# Patient Record
Sex: Female | Born: 1980
Health system: Southern US, Community
[De-identification: ages and names within clinical notes are randomized; demographics above are authoritative.]

## PROBLEM LIST (undated history)

## (undated) DIAGNOSIS — F32A Depression, unspecified: Secondary | ICD-10-CM

## (undated) DIAGNOSIS — F419 Anxiety disorder, unspecified: Secondary | ICD-10-CM

## (undated) DIAGNOSIS — E0500 Thyrotoxicosis with diffuse goiter without thyrotoxic crisis or storm: Secondary | ICD-10-CM

## (undated) DIAGNOSIS — E669 Obesity, unspecified: Secondary | ICD-10-CM

## (undated) DIAGNOSIS — G43909 Migraine, unspecified, not intractable, without status migrainosus: Secondary | ICD-10-CM

## (undated) DIAGNOSIS — F329 Major depressive disorder, single episode, unspecified: Secondary | ICD-10-CM

## (undated) HISTORY — DX: Depression, unspecified: F32.A

## (undated) HISTORY — DX: Obesity, unspecified: E66.9

## (undated) HISTORY — DX: Anxiety disorder, unspecified: F41.9

## (undated) HISTORY — DX: Migraine, unspecified, not intractable, without status migrainosus: G43.909

## (undated) HISTORY — DX: Major depressive disorder, single episode, unspecified: F32.9

## (undated) HISTORY — DX: Thyrotoxicosis with diffuse goiter without thyrotoxic crisis or storm: E05.00

## (undated) HISTORY — PX: MOUTH SURGERY: SHX715

---

## 2009-07-05 ENCOUNTER — Other Ambulatory Visit: Admission: RE | Admit: 2009-07-05 | Discharge: 2009-07-05 | Payer: Self-pay | Admitting: Family Medicine

## 2009-10-18 ENCOUNTER — Other Ambulatory Visit: Admission: RE | Admit: 2009-10-18 | Discharge: 2009-10-18 | Payer: Self-pay | Admitting: Family Medicine

## 2010-05-31 ENCOUNTER — Encounter (HOSPITAL_COMMUNITY): Admission: RE | Admit: 2010-05-31 | Discharge: 2010-07-28 | Payer: Self-pay | Admitting: Family Medicine

## 2010-06-06 ENCOUNTER — Other Ambulatory Visit: Admission: RE | Admit: 2010-06-06 | Discharge: 2010-06-06 | Payer: Self-pay | Admitting: Obstetrics and Gynecology

## 2010-12-08 ENCOUNTER — Other Ambulatory Visit (HOSPITAL_COMMUNITY): Payer: Self-pay | Admitting: Internal Medicine

## 2010-12-08 DIAGNOSIS — E049 Nontoxic goiter, unspecified: Secondary | ICD-10-CM

## 2010-12-08 DIAGNOSIS — E0590 Thyrotoxicosis, unspecified without thyrotoxic crisis or storm: Secondary | ICD-10-CM

## 2010-12-26 ENCOUNTER — Other Ambulatory Visit: Payer: Self-pay | Admitting: Obstetrics and Gynecology

## 2010-12-26 ENCOUNTER — Other Ambulatory Visit (HOSPITAL_COMMUNITY)
Admission: RE | Admit: 2010-12-26 | Discharge: 2010-12-26 | Disposition: A | Discharge status: Home / Self Care | Payer: 59 | Source: Ambulatory Visit | Attending: Obstetrics and Gynecology | Admitting: Obstetrics and Gynecology

## 2010-12-26 DIAGNOSIS — Z01419 Encounter for gynecological examination (general) (routine) without abnormal findings: Secondary | ICD-10-CM | POA: Insufficient documentation

## 2010-12-26 DIAGNOSIS — R87810 Cervical high risk human papillomavirus (HPV) DNA test positive: Secondary | ICD-10-CM | POA: Insufficient documentation

## 2010-12-29 ENCOUNTER — Other Ambulatory Visit (HOSPITAL_COMMUNITY): Payer: Self-pay | Admitting: Internal Medicine

## 2010-12-29 ENCOUNTER — Ambulatory Visit (HOSPITAL_COMMUNITY)
Admission: RE | Admit: 2010-12-29 | Discharge: 2010-12-29 | Disposition: A | Discharge status: Home / Self Care | Payer: 59 | Source: Ambulatory Visit | Attending: Internal Medicine | Admitting: Internal Medicine

## 2010-12-29 DIAGNOSIS — E0590 Thyrotoxicosis, unspecified without thyrotoxic crisis or storm: Secondary | ICD-10-CM

## 2010-12-29 DIAGNOSIS — R450 Nervousness: Secondary | ICD-10-CM | POA: Insufficient documentation

## 2010-12-29 DIAGNOSIS — R259 Unspecified abnormal involuntary movements: Secondary | ICD-10-CM | POA: Insufficient documentation

## 2010-12-29 DIAGNOSIS — E049 Nontoxic goiter, unspecified: Secondary | ICD-10-CM

## 2010-12-29 DIAGNOSIS — R220 Localized swelling, mass and lump, head: Secondary | ICD-10-CM | POA: Insufficient documentation

## 2010-12-30 ENCOUNTER — Ambulatory Visit (HOSPITAL_COMMUNITY): Admission: RE | Admit: 2010-12-30 | Payer: Self-pay | Source: Ambulatory Visit

## 2010-12-30 MED ORDER — SODIUM PERTECHNETATE TC 99M INJECTION
10.0000 | Freq: Once | INTRAVENOUS | Status: AC | PRN
  Administered 2010-12-30: 10 via INTRAVENOUS

## 2010-12-30 MED ORDER — SODIUM IODIDE I 131 CAPSULE
0.0100 | Freq: Once | INTRAVENOUS | Status: AC | PRN
  Administered 2010-12-30: 0.01 via ORAL

## 2011-01-06 ENCOUNTER — Ambulatory Visit (HOSPITAL_COMMUNITY)
Admission: RE | Admit: 2011-01-06 | Discharge: 2011-01-06 | Disposition: A | Discharge status: Home / Self Care | Payer: 59 | Source: Ambulatory Visit | Attending: Internal Medicine | Admitting: Internal Medicine

## 2011-01-06 DIAGNOSIS — E0590 Thyrotoxicosis, unspecified without thyrotoxic crisis or storm: Secondary | ICD-10-CM

## 2011-01-06 MED ORDER — SODIUM IODIDE I 131 CAPSULE
16.2900 | Freq: Once | INTRAVENOUS | Status: AC | PRN
  Administered 2011-01-06: 16.29 via ORAL

## 2011-02-28 ENCOUNTER — Other Ambulatory Visit: Payer: Self-pay | Admitting: Obstetrics and Gynecology

## 2011-07-13 ENCOUNTER — Other Ambulatory Visit (HOSPITAL_COMMUNITY)
Admission: RE | Admit: 2011-07-13 | Discharge: 2011-07-13 | Disposition: A | Discharge status: Home / Self Care | Payer: 59 | Source: Ambulatory Visit | Attending: Obstetrics and Gynecology | Admitting: Obstetrics and Gynecology

## 2011-07-13 ENCOUNTER — Other Ambulatory Visit: Payer: Self-pay | Admitting: Obstetrics and Gynecology

## 2011-07-13 DIAGNOSIS — Z01419 Encounter for gynecological examination (general) (routine) without abnormal findings: Secondary | ICD-10-CM | POA: Insufficient documentation

## 2011-07-13 DIAGNOSIS — R87810 Cervical high risk human papillomavirus (HPV) DNA test positive: Secondary | ICD-10-CM | POA: Insufficient documentation

## 2011-09-21 ENCOUNTER — Other Ambulatory Visit: Payer: Self-pay | Admitting: Obstetrics and Gynecology

## 2011-12-26 ENCOUNTER — Encounter: Payer: 59 | Admitting: Oncology

## 2011-12-27 NOTE — Progress Notes (Signed)
Note dictated for this already

## 2012-01-10 ENCOUNTER — Other Ambulatory Visit (HOSPITAL_COMMUNITY)
Admission: RE | Admit: 2012-01-10 | Discharge: 2012-01-10 | Disposition: A | Discharge status: Home / Self Care | Payer: 59 | Source: Ambulatory Visit | Attending: Obstetrics and Gynecology | Admitting: Obstetrics and Gynecology

## 2012-01-10 ENCOUNTER — Other Ambulatory Visit: Payer: Self-pay | Admitting: Obstetrics and Gynecology

## 2012-01-10 DIAGNOSIS — R87810 Cervical high risk human papillomavirus (HPV) DNA test positive: Secondary | ICD-10-CM | POA: Insufficient documentation

## 2012-01-10 DIAGNOSIS — Z01419 Encounter for gynecological examination (general) (routine) without abnormal findings: Secondary | ICD-10-CM | POA: Insufficient documentation

## 2012-07-11 ENCOUNTER — Other Ambulatory Visit: Payer: Self-pay | Admitting: Obstetrics and Gynecology

## 2012-07-11 ENCOUNTER — Other Ambulatory Visit (HOSPITAL_COMMUNITY)
Admission: RE | Admit: 2012-07-11 | Discharge: 2012-07-11 | Disposition: A | Discharge status: Home / Self Care | Payer: 59 | Source: Ambulatory Visit | Attending: Obstetrics and Gynecology | Admitting: Obstetrics and Gynecology

## 2012-07-11 DIAGNOSIS — Z01419 Encounter for gynecological examination (general) (routine) without abnormal findings: Secondary | ICD-10-CM | POA: Insufficient documentation

## 2012-07-11 DIAGNOSIS — Z1151 Encounter for screening for human papillomavirus (HPV): Secondary | ICD-10-CM | POA: Insufficient documentation

## 2012-07-11 DIAGNOSIS — R87810 Cervical high risk human papillomavirus (HPV) DNA test positive: Secondary | ICD-10-CM | POA: Insufficient documentation

## 2012-08-13 ENCOUNTER — Telehealth: Payer: Self-pay | Admitting: *Deleted

## 2012-08-13 NOTE — Telephone Encounter (Signed)
*  error in charting 

## 2012-09-10 ENCOUNTER — Other Ambulatory Visit: Payer: Self-pay | Admitting: Obstetrics and Gynecology

## 2013-01-08 ENCOUNTER — Other Ambulatory Visit (HOSPITAL_COMMUNITY)
Admission: RE | Admit: 2013-01-08 | Discharge: 2013-01-08 | Disposition: A | Discharge status: Home / Self Care | Payer: 59 | Source: Ambulatory Visit | Attending: Obstetrics and Gynecology | Admitting: Obstetrics and Gynecology

## 2013-01-08 ENCOUNTER — Other Ambulatory Visit: Payer: Self-pay | Admitting: Obstetrics and Gynecology

## 2013-01-08 DIAGNOSIS — Z01419 Encounter for gynecological examination (general) (routine) without abnormal findings: Secondary | ICD-10-CM | POA: Insufficient documentation

## 2013-01-30 ENCOUNTER — Ambulatory Visit (INDEPENDENT_AMBULATORY_CARE_PROVIDER_SITE_OTHER): Payer: 59 | Admitting: Neurology

## 2013-01-30 ENCOUNTER — Encounter: Payer: Self-pay | Admitting: Neurology

## 2013-01-30 VITALS — BP 114/78 | HR 70 | Temp 98.9°F | Ht 66.0 in | Wt 209.0 lb

## 2013-01-30 DIAGNOSIS — E669 Obesity, unspecified: Secondary | ICD-10-CM

## 2013-01-30 DIAGNOSIS — R251 Tremor, unspecified: Secondary | ICD-10-CM | POA: Insufficient documentation

## 2013-01-30 DIAGNOSIS — E0500 Thyrotoxicosis with diffuse goiter without thyrotoxic crisis or storm: Secondary | ICD-10-CM

## 2013-01-30 DIAGNOSIS — G43909 Migraine, unspecified, not intractable, without status migrainosus: Secondary | ICD-10-CM

## 2013-01-30 DIAGNOSIS — R259 Unspecified abnormal involuntary movements: Secondary | ICD-10-CM

## 2013-01-30 HISTORY — DX: Migraine, unspecified, not intractable, without status migrainosus: G43.909

## 2013-01-30 HISTORY — DX: Obesity, unspecified: E66.9

## 2013-01-30 NOTE — Progress Notes (Signed)
Subjective:    Patient ID: Natasha Li is a 32 y.o. female.  HPI  Huston Foley, MD, PhD [facility] [address] [address] [place], [place] [address]  Dear Dr. Kevan Ny,  I saw your patient, Natasha Li, upon your kind request in my neurologic clinic today for initial consultation of her hand tremor. The patient is unaccompanied today. As you know, Natasha Li is a very pleasant 32 year old right-handed woman who has noticed a bilateral hand tremor for the past month or so. It is described as almost always constant and particularly worse with handwriting. She has not noticed any shaking of her voice or her head and there is no family history of any tremor. Of note, she has an underlying history of Graves' disease, diagnosed 2 1/2 or 3 y ago, status post ablative surgery with subsequent hypothyroidism, followed by Dr. Sharl Ma. When she first started having thyroid problems, she had a very severe tremor, but that improved with treatment of her thyroid d/s. She was recently checked in that regard and was told that her thyroid was not causing the tremors. She also has a underlying diagnosis of depression or bipolar affective disorder and is followed by Dr. Lonell Face. She is on Lamictal and Prozac for mood and discontinued both medications for about a month, because she could not afford them at the time and when she restarted her medications, that's when she noted the tremor. Her medications include Xanax 0.5 mg each bedtime when necessary, multivitamin, atenolol 50 mg once daily, Ortho Tri-Cyclen, levothyroxine 50 mcg once daily, Imitrex 100 mg as needed for migraine headaches, Prozac 60 mg once daily, Lamictal 300 mg 1-1/2 tablets once daily at bedtime. She has not noticed any particular triggers or alleviating factors or time of day when the tremors are worse. It tends to vary from day to day and also during the day. She has noticed it while typing on the computer and while holding  something on her arms. It is more of a nuisance rather than a true hindrance for her. She has not noticed any association with how well she is rested or how well she feels in general. She does not drink any  caffeine needed beverages on a day-to-day basis.  She denies snoring and actually had a sleep study, because of complaint of tiredness and she was told that she did not have OSA and that she did not go into REM sleep at the time.  She is taking Atenolol for tachycardia in the context of her thyroid dysfunction.  She has a history of migraine headaches since age 66 and has them about once every month and has had good success with prn Imitrex.   Her Past Medical History Is Significant For: Past Medical History  Diagnosis Date  . Graves disease   . Depression   . Anxiety   . Obesity, unspecified 01/30/2013  . Migraine, unspecified, without mention of intractable migraine without mention of status migrainosus 01/30/2013    Her Past Surgical History Is Significant For: Past Surgical History  Procedure Laterality Date  . Mouth surgery  19 years ago    wisdom teeth    Her Family History Is Significant For: Family History  Problem Relation Age of Onset  . Thyroid disease Maternal Uncle   . Glaucoma Father     Her Social History Is Significant For: History   Social History  . Marital Status: Single    Spouse Name: N/A    Number of Children:  N/A  . Years of Education: N/A   Social History Main Topics  . Smoking status: Current Every Day Smoker    Types: Cigarettes, Pipe, Cigars  . Smokeless tobacco: None  . Alcohol Use: 0.5 oz/week    1 drink(s) per week  . Drug Use: No  . Sexually Active: Yes -- Female partner(s)   Other Topics Concern  . None   Social History Narrative             Her Allergies Are:  No Known Allergies:   Her Current Medications Are:  Outpatient Encounter Prescriptions as of 01/30/2013  Medication Sig Dispense Refill  . ALPRAZolam (XANAX) 0.5 MG  tablet       . atenolol (TENORMIN) 50 MG tablet       . FLUoxetine (PROZAC) 20 MG capsule       . lamoTRIgine (LAMICTAL) 200 MG tablet       . ORTHO TRI-CYCLEN, 28, 0.18/0.215/0.25 MG-35 MCG tablet       . SUMAtriptan (IMITREX) 100 MG tablet        No facility-administered encounter medications on file as of 01/30/2013.  :  Review of Systems  Constitutional: Positive for fatigue and unexpected weight change (gain).  Endocrine: Positive for heat intolerance and polydipsia.  Skin:       moles  Psychiatric/Behavioral: Positive for sleep disturbance (too much), dysphoric mood and agitation.       Racing thoughts    Objective:  Neurologic Exam  Physical Exam  Physical Examination:   Filed Vitals:   01/30/13 0848  BP: 114/78  Pulse: 70  Temp: 98.9 F (37.2 C)    General Examination: The patient is a very pleasant 32 y.o. female in no acute distress.  HEENT: Normocephalic, atraumatic, pupils are equal, round and reactive to light and accommodation. Funduscopic exam is normal with sharp disc margins noted. Extraocular tracking is good without nystagmus noted. Normal smooth pursuit is noted. Hearing is grossly intact. Tympanic membranes are clear bilaterally. Face is symmetric with normal facial animation and normal facial sensation. Speech is clear with no dysarthria noted. There is no hypophonia. There is no lip, neck or jaw tremor. Neck is supple with full range of motion. There are no carotid bruits on auscultation. Oropharynx exam reveals normal findings. No significant airway crowding is noted. Mallampati is class II. Tongue protrudes centrally and palate elevates symmetrically. Tonsils are 1+.   Chest: is clear to auscultation without wheezing, rhonchi or crackles noted.  Heart: sounds are regular and normal without murmurs, rubs or gallops noted.   Abdomen: is soft, non-tender and non-distended with normal bowel sounds appreciated on auscultation.  Extremities: There is no  pitting edema in the distal lower extremities bilaterally. Pedal pulses are intact.  Skin: is warm and dry with no trophic changes noted.  Musculoskeletal: exam reveals no obvious joint deformities, tenderness or joint swelling or erythema.  Neurologically:  Mental status: The patient is awake, alert and oriented in all 4 spheres. Her memory, attention, language and knowledge are appropriate. There is no aphasia, agnosia, apraxia or anomia. Speech is clear with normal prosody and enunciation. Thought process is linear. Mood is congruent and normal. Affect is normal.  Cranial nerves are as described above under HEENT exam. In addition, shoulder shrug is normal with equal shoulder height noted. Motor exam: Normal bulk, strength and tone is noted. There is no drift, resting tremor or rebound. Romberg is negative. Reflexes are 2+ throughout. Toes are downgoing bilaterally. Fine motor  skills are intact with normal finger taps, normal hand movements, normal rapid alternating patting, normal foot taps and normal foot agility. She had a slight bilateral UE postural and action tremor. On Archimedes spiral drawing she has a minimal bilateral degree of tremor, her handwriting is non-tremulous.  Cerebellar testing shows no dysmetria or intention tremor on finger to nose testing. There is no truncal or gait ataxia.  Sensory exam is intact to light touch, pinprick, vibration, temperature sense and proprioception in the upper and lower extremities.  Gait, station and balance are unremarkable. No veering to one side is noted. No leaning to one side. Posture is age-appropriate and stance is narrow based. No problems turning are noted.                Assessment and Plan:     Assessment and Plan:  In summary, Natasha Li is a very pleasant 33 y.o.-year old female with a history of hand tremors. Most likely, this represents a drug-induced tremor versus enhanced physiological tremor. It is rather benign at this point  I reassured the patient that she has a nonfocal exam otherwise. She certainly does not have any features of parkinsonism. She is advised about the possible diagnosis, its prognosis and treatment options. This is a rather mild presentation at this moment and I would not recommend any new medication for this. I had a long chat with the patient about my findings and the diagnosis, its prognosis and treatment options. We talked about medical treatments and non-pharmacological approaches. We talked about Assuming the watchful waiting position at this moment. I do not think it's worth our while to try to change her from Prozac to another antidepressant. In particular, she has been tried on other antidepressants in the past on Prozac is working well for her and she feels stable mood wise. We talked about maintaining a healthy lifestyle in general and also I advised her about common tremor triggers including blood sugar fluctuations, nervousness, being anxious, dehydration, not being rested and so forth. I encouraged the patient to eat healthy, exercise daily and keep well hydrated, to keep a scheduled bedtime and wake time routine, to not skip any meals and eat healthy snacks in between meals and to have protein with every meal.   I answered all her questions today and the patient  was in agreement with the plan. I would like to see her back in 6 months, sooner if the need arises and encouraged her to call with any interim questions, concerns, problems or updates.   Thank you very much for allowing me to participate in the care of this nice patient. If I can be of any further assistance to you please do not hesitate to call me at [phone].  Sincerely,   Huston Foley, MD, PhD

## 2013-01-30 NOTE — Patient Instructions (Addendum)
I think you have a benign tremor, it may be drug induced vs. Enhanced physiologic tremor. Your exam otherwise in normal.   I do have some generic suggestions for you today:   Please make sure that you drink plenty of fluids. I would like for you to exercise daily for example in the form of walking 20-30 minutes every day. Please keep regular sleep-wake schedule, keep regular meal times, do not skip any meals, eat  healthy snacks in between meals. Try to eat protein with every meal.  I do not think we need to make any changes in your medications at this point. I think you're stable enough that I can see you back in 6 months. Please call us if you have any interim questions, concerns, or problems or updates to discuss. We are not doing any new tests at this moment. Brett Canales is my clinical assistant and will answer any of your questions and relay your messages to me.  Our phone number is [phone]. We also have an after hours call service as well as a physician on-call for urgent questions. For any emergencies you know to call 911 or go to the emergency room.

## 2013-06-10 ENCOUNTER — Telehealth: Payer: Self-pay | Admitting: Neurology

## 2013-07-10 ENCOUNTER — Other Ambulatory Visit (HOSPITAL_COMMUNITY)
Admission: RE | Admit: 2013-07-10 | Discharge: 2013-07-10 | Disposition: A | Discharge status: Home / Self Care | Payer: 59 | Source: Ambulatory Visit | Attending: Obstetrics and Gynecology | Admitting: Obstetrics and Gynecology

## 2013-07-10 ENCOUNTER — Other Ambulatory Visit: Payer: Self-pay | Admitting: Obstetrics and Gynecology

## 2013-07-10 DIAGNOSIS — Z1151 Encounter for screening for human papillomavirus (HPV): Secondary | ICD-10-CM | POA: Insufficient documentation

## 2013-07-10 DIAGNOSIS — Z01419 Encounter for gynecological examination (general) (routine) without abnormal findings: Secondary | ICD-10-CM | POA: Insufficient documentation

## 2013-07-22 ENCOUNTER — Ambulatory Visit: Payer: 59 | Admitting: Neurology

## 2013-08-01 ENCOUNTER — Ambulatory Visit: Payer: 59 | Admitting: Neurology

## 2013-10-13 ENCOUNTER — Ambulatory Visit (INDEPENDENT_AMBULATORY_CARE_PROVIDER_SITE_OTHER): Payer: 59 | Admitting: Podiatry

## 2013-10-13 ENCOUNTER — Encounter: Payer: Self-pay | Admitting: Podiatry

## 2013-10-13 VITALS — BP 108/75 | HR 84 | Resp 18

## 2013-10-13 DIAGNOSIS — L03039 Cellulitis of unspecified toe: Secondary | ICD-10-CM

## 2013-10-13 NOTE — Patient Instructions (Signed)

## 2013-10-13 NOTE — Progress Notes (Signed)
° °  Subjective:    Patient ID: Natasha Li, female    DOB: 02/21/1981, 32 y.o.   MRN: [id number]  HPI had my big toenails done in march or April and now my left big toenail and my pointing up and puss was coming out of it and left big toenail has been going on since mid November    Review of Systems     Objective:   Physical Exam        Assessment & Plan:

## 2013-10-15 NOTE — Progress Notes (Signed)
Subjective:     Patient ID: Natasha Li, female   DOB: Jun 02, 1981, 32 y.o.   MRN: [id number]  HPI patient states that I'm having a lot of problems with his big toenail on my left foot. It was doing fine and now it has become red on both the inside and outside with drainage that I have tried to soak without relief   Review of Systems     Objective:   Physical Exam Neurovascular status intact with no health history changes noted. Patient is found to have a erythematous painful left hallux nail is loose with damage to both medial and lateral side and necrosis noted    Assessment:     Significant paronychia infection with damage hallux nail left foot that has failed to respond to nail corner removal    Plan:     Educated patient on deformity and at this time I have recommended removal of the nail and removal of necrotic tissue from both medial and lateral side of the bed. I then discussed long-term permanent removal of the nail bed. Infiltrated the left hallux 60 mg Xylocaine Marcaine mixture remove the hallux nail and using sterile sharp instrumentation deep debridement of both the medial and lateral side and reduced and removed necrotic tissue and allowed channel for drainage. Applied sterile dressing and instructed on soaks

## 2013-11-03 ENCOUNTER — Encounter: Payer: Self-pay | Admitting: Podiatry

## 2013-11-03 ENCOUNTER — Ambulatory Visit (INDEPENDENT_AMBULATORY_CARE_PROVIDER_SITE_OTHER): Payer: 59 | Admitting: Podiatry

## 2013-11-03 VITALS — BP 131/95 | HR 117 | Temp 99.5°F | Resp 17 | Ht 64.0 in | Wt 204.0 lb

## 2013-11-03 DIAGNOSIS — L600 Ingrowing nail: Secondary | ICD-10-CM

## 2013-11-03 NOTE — Progress Notes (Signed)
Subjective:     Patient ID: Natasha Li, female   DOB: [birth year], 32 y.o.   MRN: [id number]  HPI patient presents with left foot stating the nailbed seems to be healing okay but I like to get these corners fixed so I don't get ingrown to the corner like I have had in the past   Review of Systems     Objective:   Physical Exam Neurovascular status intact with a granulated nailbed left hallux that has history of incurvation in the corners with no indication of the current infection    Assessment:     Healing well from nail removal secondary to acute paronychia infection left hallux 4 weeks ago    Plan:     Recommended removal of the corners and allowing the rest of the nail to regrow explaining it may not be grow normally and ultimately she may lose the entire nail. Patient wants to procedure understanding all risks associated with nail surgery and today I infiltrated 60 mg Xylocaine Marcaine mixture removed the nail bed exposed the nail plates on the medial lateral side and just on the corners apply chemical phenol 3 applications followed by alcohol lavaged and sterile dressing reappoint her recheck

## 2013-11-03 NOTE — Patient Instructions (Signed)

## 2013-11-03 NOTE — Progress Notes (Signed)
Pt states "It feels okay, it just looks ucky."

## 2014-02-05 NOTE — Telephone Encounter (Signed)
Closing encounter

## 2014-07-14 ENCOUNTER — Other Ambulatory Visit (HOSPITAL_COMMUNITY)
Admission: RE | Admit: 2014-07-14 | Discharge: 2014-07-14 | Disposition: A | Discharge status: Home / Self Care | Payer: 59 | Source: Ambulatory Visit | Attending: Obstetrics and Gynecology | Admitting: Obstetrics and Gynecology

## 2014-07-14 ENCOUNTER — Other Ambulatory Visit: Payer: Self-pay | Admitting: Obstetrics and Gynecology

## 2014-07-14 DIAGNOSIS — Z01419 Encounter for gynecological examination (general) (routine) without abnormal findings: Secondary | ICD-10-CM | POA: Diagnosis present

## 2014-07-16 LAB — CYTOLOGY - PAP

## 2014-09-29 ENCOUNTER — Encounter: Payer: Self-pay | Admitting: *Deleted

## 2015-07-20 ENCOUNTER — Other Ambulatory Visit: Payer: Self-pay | Admitting: Obstetrics and Gynecology

## 2015-07-20 ENCOUNTER — Other Ambulatory Visit (HOSPITAL_COMMUNITY)
Admission: RE | Admit: 2015-07-20 | Discharge: 2015-07-20 | Disposition: A | Discharge status: Home / Self Care | Payer: 59 | Source: Ambulatory Visit | Attending: Obstetrics and Gynecology | Admitting: Obstetrics and Gynecology

## 2015-07-20 DIAGNOSIS — Z01419 Encounter for gynecological examination (general) (routine) without abnormal findings: Secondary | ICD-10-CM | POA: Insufficient documentation

## 2015-07-22 LAB — CYTOLOGY - PAP

## 2015-08-31 ENCOUNTER — Encounter: Payer: Self-pay | Admitting: Podiatry

## 2015-08-31 ENCOUNTER — Ambulatory Visit (INDEPENDENT_AMBULATORY_CARE_PROVIDER_SITE_OTHER): Payer: 59 | Admitting: Podiatry

## 2015-08-31 VITALS — BP 130/78 | HR 73 | Resp 16

## 2015-08-31 DIAGNOSIS — L600 Ingrowing nail: Secondary | ICD-10-CM | POA: Diagnosis not present

## 2015-08-31 MED ORDER — CEPHALEXIN 500 MG PO CAPS: 500.0000 mg | ORAL_CAPSULE | Freq: Three times a day (TID) | ORAL | Status: DC

## 2015-08-31 MED ORDER — CLINDAMYCIN HCL 150 MG PO CAPS: 150.0000 mg | ORAL_CAPSULE | Freq: Three times a day (TID) | ORAL | Status: DC

## 2015-08-31 NOTE — Patient Instructions (Addendum)

## 2015-09-01 NOTE — Progress Notes (Signed)
Subjective:     Patient ID: Natasha Li, female   DOB: May 15, 1981, 34 y.o.   MRN: [id number]  HPI patient states I been having some redness in my left big toenail and I'm not sure when this started but it's been recently and I did well when you remove the nail several years ago   Review of Systems     Objective:   Physical Exam Neurovascular status intact muscle strength adequate with redness around the lateral side of the left hallux at the proximal nail fold with no redness past the interphalangeal joint or drainage or swelling. The areas moderately tender when pressed    Assessment:     Paronychia infection of the left hallux lateral side with no history of trauma    Plan:     Precautionary x-ray reviewed with patient and I then went ahead infiltrated the left hallux 60 mg Xylocaine Marcaine mixture removed the lateral border removed all proud flesh abscess tissue and allowed channel for drainage. Instructed on soaks

## 2015-09-03 ENCOUNTER — Telehealth: Payer: Self-pay | Admitting: *Deleted

## 2015-09-03 NOTE — Telephone Encounter (Signed)
Left message for patient at [phone] (Home #) to check to see how they were doing from their ingrown toenail procedure that was performed on Tuesday, August 31, 2015. Waiting for a response.

## 2016-03-15 ENCOUNTER — Ambulatory Visit (INDEPENDENT_AMBULATORY_CARE_PROVIDER_SITE_OTHER): Payer: 59 | Admitting: Podiatry

## 2016-03-15 DIAGNOSIS — L600 Ingrowing nail: Secondary | ICD-10-CM | POA: Diagnosis not present

## 2016-03-15 NOTE — Patient Instructions (Addendum)

## 2016-03-16 NOTE — Progress Notes (Signed)
Subjective:     Patient ID: Natasha Li, female   DOB: 1981-01-19, 35 y.o.   MRN: [id number]  HPI patient presents with pain in the right hallux lateral border with incurvation of the bed and multiple signs of ingrown toenail   Review of Systems     Objective:   Physical Exam Neurovascular status intact muscle strength adequate range of motion within normal limits with incurvation of the right hallux lateral border with exquisite discomfort noted laterally with distal redness and slight drainage but localized in nature    Assessment:     Severe ingrown toenail deformity right hallux lateral border    Plan:     H&P and condition reviewed with patient. I've recommended removal of the corner of the nail and I explained procedure and risk and patient wants procedure. Today I went ahead and I infiltrated the right hallux 60 mg Xylocaine Marcaine mixture remove the lateral corner exposed matrix and applied phenol 3 applications 30 seconds followed by alcohol lavage and sterile dressing. Gave instructions on soaks and reappoint

## 2016-03-24 ENCOUNTER — Telehealth: Payer: Self-pay | Admitting: *Deleted

## 2016-03-24 NOTE — Telephone Encounter (Signed)
Left message for patient at [phone] (Home #) to check to see how they were doing from their ingrown toenail procedure that was performed on Wednesday, Mar 15, 2016. Waiting for a response.

## 2016-07-21 ENCOUNTER — Other Ambulatory Visit (HOSPITAL_COMMUNITY)
Admission: RE | Admit: 2016-07-21 | Discharge: 2016-07-21 | Disposition: A | Discharge status: Home / Self Care | Payer: 59 | Source: Ambulatory Visit | Attending: Obstetrics and Gynecology | Admitting: Obstetrics and Gynecology

## 2016-07-21 ENCOUNTER — Other Ambulatory Visit: Payer: Self-pay | Admitting: Obstetrics and Gynecology

## 2016-07-21 DIAGNOSIS — Z1151 Encounter for screening for human papillomavirus (HPV): Secondary | ICD-10-CM | POA: Diagnosis not present

## 2016-07-21 DIAGNOSIS — Z01419 Encounter for gynecological examination (general) (routine) without abnormal findings: Secondary | ICD-10-CM | POA: Insufficient documentation

## 2016-07-24 LAB — CYTOLOGY - PAP

## 2016-12-29 DIAGNOSIS — E039 Hypothyroidism, unspecified: Secondary | ICD-10-CM | POA: Diagnosis not present

## 2017-03-01 DIAGNOSIS — Z13220 Encounter for screening for lipoid disorders: Secondary | ICD-10-CM | POA: Diagnosis not present

## 2017-03-01 DIAGNOSIS — G4711 Idiopathic hypersomnia with long sleep time: Secondary | ICD-10-CM | POA: Diagnosis not present

## 2017-08-28 DIAGNOSIS — Z01419 Encounter for gynecological examination (general) (routine) without abnormal findings: Secondary | ICD-10-CM | POA: Diagnosis not present

## 2017-09-05 DIAGNOSIS — G4711 Idiopathic hypersomnia with long sleep time: Secondary | ICD-10-CM | POA: Diagnosis not present

## 2018-01-02 DIAGNOSIS — E038 Other specified hypothyroidism: Secondary | ICD-10-CM | POA: Diagnosis not present

## 2018-01-02 DIAGNOSIS — E0500 Thyrotoxicosis with diffuse goiter without thyrotoxic crisis or storm: Secondary | ICD-10-CM | POA: Diagnosis not present

## 2018-01-02 DIAGNOSIS — E063 Autoimmune thyroiditis: Secondary | ICD-10-CM | POA: Diagnosis not present

## 2018-01-15 DIAGNOSIS — Z86018 Personal history of other benign neoplasm: Secondary | ICD-10-CM | POA: Diagnosis not present

## 2018-01-15 DIAGNOSIS — D1801 Hemangioma of skin and subcutaneous tissue: Secondary | ICD-10-CM | POA: Diagnosis not present

## 2018-01-15 DIAGNOSIS — L814 Other melanin hyperpigmentation: Secondary | ICD-10-CM | POA: Diagnosis not present

## 2018-05-01 DIAGNOSIS — G43909 Migraine, unspecified, not intractable, without status migrainosus: Secondary | ICD-10-CM | POA: Diagnosis not present

## 2018-05-01 DIAGNOSIS — G4711 Idiopathic hypersomnia with long sleep time: Secondary | ICD-10-CM | POA: Diagnosis not present

## 2018-07-18 ENCOUNTER — Telehealth: Payer: Self-pay | Admitting: *Deleted

## 2018-07-18 NOTE — Telephone Encounter (Signed)
Chart opened by mistake

## 2018-08-06 DIAGNOSIS — Z23 Encounter for immunization: Secondary | ICD-10-CM | POA: Diagnosis not present

## 2018-10-17 DIAGNOSIS — Z01419 Encounter for gynecological examination (general) (routine) without abnormal findings: Secondary | ICD-10-CM | POA: Diagnosis not present

## 2018-10-31 DIAGNOSIS — G4711 Idiopathic hypersomnia with long sleep time: Secondary | ICD-10-CM | POA: Diagnosis not present

## 2018-10-31 DIAGNOSIS — E890 Postprocedural hypothyroidism: Secondary | ICD-10-CM | POA: Diagnosis not present

## 2018-11-05 DIAGNOSIS — Z13220 Encounter for screening for lipoid disorders: Secondary | ICD-10-CM | POA: Diagnosis not present

## 2018-11-05 DIAGNOSIS — Z131 Encounter for screening for diabetes mellitus: Secondary | ICD-10-CM | POA: Diagnosis not present

## 2018-12-11 DIAGNOSIS — L659 Nonscarring hair loss, unspecified: Secondary | ICD-10-CM | POA: Diagnosis not present

## 2018-12-11 DIAGNOSIS — E038 Other specified hypothyroidism: Secondary | ICD-10-CM | POA: Diagnosis not present

## 2018-12-11 DIAGNOSIS — E0500 Thyrotoxicosis with diffuse goiter without thyrotoxic crisis or storm: Secondary | ICD-10-CM | POA: Diagnosis not present

## 2019-01-14 DIAGNOSIS — D225 Melanocytic nevi of trunk: Secondary | ICD-10-CM | POA: Diagnosis not present

## 2019-01-14 DIAGNOSIS — Z86018 Personal history of other benign neoplasm: Secondary | ICD-10-CM | POA: Diagnosis not present

## 2019-01-14 DIAGNOSIS — L814 Other melanin hyperpigmentation: Secondary | ICD-10-CM | POA: Diagnosis not present

## 2020-01-11 ENCOUNTER — Ambulatory Visit: Payer: 59 | Attending: Internal Medicine

## 2020-01-11 DIAGNOSIS — Z23 Encounter for immunization: Secondary | ICD-10-CM | POA: Insufficient documentation

## 2020-01-11 NOTE — Progress Notes (Signed)
   Covid-19 Vaccination Clinic  Name:  Natasha Li    MRN: [id number] DOB: 1980/12/11  01/11/2020  [name] was observed post Covid-19 immunization for 15 minutes without incident. She was provided with Vaccine Information Sheet and instruction to access the V-Safe system.   [name] was instructed to call 911 with any severe reactions post vaccine: Marland Kitchen Difficulty breathing  . Swelling of face and throat  . A fast heartbeat  . A bad rash all over body  . Dizziness and weakness   Immunizations Administered    Name Date Dose VIS Date Route   Pfizer COVID-19 Vaccine 01/11/2020  8:27 AM 0.3 mL 10/17/2019 Intramuscular   Manufacturer: [place]   Lot: [id number]   [place]: [id number]

## 2020-02-10 ENCOUNTER — Ambulatory Visit: Payer: 59 | Attending: Internal Medicine

## 2020-02-10 DIAGNOSIS — Z23 Encounter for immunization: Secondary | ICD-10-CM

## 2020-02-10 NOTE — Progress Notes (Signed)
   Covid-19 Vaccination Clinic  Name:  Natasha Li    MRN: [id number] DOB: 11/19/1980  02/10/2020  [name] was observed post Covid-19 immunization for 15 minutes without incident. She was provided with Vaccine Information Sheet and instruction to access the V-Safe system.   [name] was instructed to call 911 with any severe reactions post vaccine: Marland Kitchen Difficulty breathing  . Swelling of face and throat  . A fast heartbeat  . A bad rash all over body  . Dizziness and weakness   Immunizations Administered    Name Date Dose VIS Date Route   Pfizer COVID-19 Vaccine 02/10/2020  1:54 PM 0.3 mL 10/17/2019 Intramuscular   Manufacturer: [employer], [employer]   Lot: [id number]   [place]: [id number]

## 2020-06-07 ENCOUNTER — Institutional Professional Consult (permissible substitution): Payer: 59 | Admitting: Neurology

## 2020-07-05 ENCOUNTER — Encounter: Payer: Self-pay | Admitting: Neurology

## 2020-07-05 ENCOUNTER — Ambulatory Visit: Payer: 59 | Admitting: Neurology

## 2020-07-05 ENCOUNTER — Other Ambulatory Visit: Payer: Self-pay

## 2020-07-05 VITALS — BP 122/84 | HR 91 | Ht 64.5 in | Wt 180.0 lb

## 2020-07-05 DIAGNOSIS — G43709 Chronic migraine without aura, not intractable, without status migrainosus: Secondary | ICD-10-CM | POA: Diagnosis not present

## 2020-07-05 MED ORDER — UBRELVY 50 MG PO TABS: 50.0000 mg | ORAL_TABLET | ORAL | 3 refills | Status: DC | PRN

## 2020-07-05 NOTE — Progress Notes (Signed)
Subjective:    Patient ID: Natasha Li is a 39 y.o. female.  HPI     Natasha Age, MD, PhD [facility] [address], [address] [place], [place] [address]  Dear Dr. Lindell Noe,  I saw your patient, Natasha Li, upon your kind request in my neurologic clinic today for initial consultation of her recurrent headaches.  The patient is unaccompanied today.  As you know, Natasha Li is a 39 year old right-handed woman with an underlying medical history of Graves' disease with post ablative hypothyroidism, history of idiopathic hypersomnia on Provigil, migraine headaches, on Imitrex, hyperlipidemia, hyperhidrosis, depression and borderline obesity, who reports a longstanding history of migraine headaches.  She has had infrequent migraines and good results with Imitrex.  In the past 6 months, she has had more milder headaches in the bifrontal areas, not typically associated with the right eye pain and nausea that she is used to.  She does have relief with Imitrex for those headaches as well.  These headaches are typically around her menstrual cycle.  She tries ibuprofen or Tylenol before resorting to taking Imitrex.  She has been on Provigil for years.  She was having significant daytime somnolence.  She had a sleep study in or around 2014 which did not show any sleep apnea but she was placed on Provigil.  She also reports that she was told that she never went into REM sleep.  Her tremor is better.  She takes hydroxyzine as needed for anxiety.  She has lost weight over time.  She tries to hydrate well with water and drinks alcohol about once a week, she reports that her sleep is good, she lives alone, but has not been told that she snores and makes abnormal noises while asleep.  She has woken up with a headache at times.  She has not had an eye examination in years.  She does not have any prescription eyeglasses.  Her typical migraine is located on the right side, particularly  starts behind or around her right eye and associated with nausea and light sensitivity.  He denies any increase in stress, no significant change in her lifestyle.  She currently does not have a significant other.  She does not drink any caffeine on a day-to-day basis.  She no longer takes atenolol. I reviewed your office note from 05/06/2020.  I had evaluated her over 7 years ago for tremors.  She denies any muscle spasms or stiffness in her neck muscles or upper shoulder areas.  Previously:   01/30/13: 39 year old right-handed woman who has noticed a bilateral hand tremor for the past month or so. It is described as almost always constant and particularly worse with handwriting. She has not noticed any shaking of her voice or her head and there is no family history of any tremor. Of note, she has an underlying history of Graves' disease, diagnosed 2 1/2 or 3 y ago, status post ablative surgery with subsequent hypothyroidism, followed by Dr. Buddy Duty. When she first started having thyroid problems, she had a very severe tremor, but that improved with treatment of her thyroid d/s. She was recently checked in that regard and was told that her thyroid was not causing the tremors. She also has a underlying diagnosis of depression or bipolar affective disorder and is followed by Dr. Janeth Rase. She is on Lamictal and Prozac for mood and discontinued both medications for about a month, because she could not afford them at the time and when she restarted her  medications, that's when she noted the tremor. Her medications include Xanax 0.5 mg each bedtime when necessary, multivitamin, atenolol 50 mg once daily, Ortho Tri-Cyclen, levothyroxine 50 mcg once daily, Imitrex 100 mg as needed for migraine headaches, Prozac 60 mg once daily, Lamictal 300 mg 1-1/2 tablets once daily at bedtime. She has not noticed any particular triggers or alleviating factors or time of day when the tremors are worse. It tends to vary from day to day and  also during the day. She has noticed it while typing on the computer and while holding something on her arms. It is more of a nuisance rather than a true hindrance for her. She has not noticed any association with how well she is rested or how well she feels in general. She does not drink any  caffeine needed beverages on a day-to-day basis.   She denies snoring and actually had a sleep study, because of complaint of tiredness and she was told that she did not have OSA and that she did not go into REM sleep at the time.  She is taking Atenolol for tachycardia in the context of her thyroid dysfunction.  She has a history of migraine headaches since Li 37 and has them about once every month and has had good success with prn Imitrex. Her Past Medical History Is Significant For: Past Medical History:  Diagnosis Date  . Anxiety   . Depression   . Graves disease   . Migraine, unspecified, without mention of intractable migraine without mention of status migrainosus 01/30/2013  . Obesity, unspecified 01/30/2013    Her Past Surgical History Is Significant For: Past Surgical History:  Procedure Laterality Date  . MOUTH SURGERY  19 years ago   wisdom teeth    Her Family History Is Significant For: Family History  Problem Relation Li of Onset  . Glaucoma Father   . Thyroid disease Maternal Grandfather   . Thyroid disease Maternal Uncle     Her Social History Is Significant For: Social History   Socioeconomic History  . Marital status: Single    Spouse name: Not on file  . Number of children: Not on file  . Years of education: Not on file  . Highest education level: Not on file  Occupational History  . Not on file  Tobacco Use  . Smoking status: Never Smoker  . Smokeless tobacco: Never Used  Substance and Sexual Activity  . Alcohol use: No    Alcohol/week: 1.0 standard drink    Types: 1 Standard drinks or equivalent per week  . Drug use: No  . Sexual activity: Yes    Partners:  Male  Other Topics Concern  . Not on file  Social History Narrative            Social Determinants of Health   Financial Resource Strain:   . Difficulty of Paying Living Expenses: Not on file  Food Insecurity:   . Worried About Charity fundraiser in the Last Year: Not on file  . Ran Out of Food in the Last Year: Not on file  Transportation Needs:   . Lack of Transportation (Medical): Not on file  . Lack of Transportation (Non-Medical): Not on file  Physical Activity:   . Days of Exercise per Week: Not on file  . Minutes of Exercise per Session: Not on file  Stress:   . Feeling of Stress : Not on file  Social Connections:   . Frequency of Communication with Friends and  Family: Not on file  . Frequency of Social Gatherings with Friends and Family: Not on file  . Attends Religious Services: Not on file  . Active Member of Clubs or Organizations: Not on file  . Attends Archivist Meetings: Not on file  . Marital Status: Not on file    Her Allergies Are:  No Known Allergies:   Her Current Medications Are:  Outpatient Encounter Medications as of 07/05/2020  Medication Sig  . FLUoxetine (PROZAC) 20 MG capsule   . hydrOXYzine (ATARAX/VISTARIL) 25 MG tablet Take 25 mg by mouth 3 (three) times daily as needed.  Marland Kitchen levothyroxine (SYNTHROID) 75 MCG tablet Take by mouth.  . levothyroxine (SYNTHROID, LEVOTHROID) 50 MCG tablet   . modafinil (PROVIGIL) 100 MG tablet   . Multiple Vitamin (MULTIVITAMIN) tablet Take 1 tablet by mouth daily.  . ORTHO TRI-CYCLEN, 28, 0.18/0.215/0.25 MG-35 MCG tablet   . SUMAtriptan (IMITREX) 100 MG tablet as needed.   . [DISCONTINUED] atenolol (TENORMIN) 50 MG tablet   . [DISCONTINUED] clindamycin (CLEOCIN) 150 MG capsule Take 1 capsule (150 mg total) by mouth 3 (three) times daily.   No facility-administered encounter medications on file as of 07/05/2020.  :   Review of Systems:  Out of a complete 14 point review of systems, all are reviewed  and negative with the exception of these symptoms as listed below:  Review of Systems  Neurological:       Here for consult on headaches. Pt reports h/a are worse around her period and will have occasional migraines.  She is currently on Imitrex, PRN and reports this med works well.    Objective:  Neurological Exam  Physical Exam Physical Examination:   Vitals:   07/05/20 0859  BP: 122/84  Pulse: 91  SpO2: 97%    General Examination: The patient is a very pleasant 39 y.o. female in no acute distress. She appears well-developed and well-nourished and well groomed.   HEENT: Normocephalic, atraumatic, pupils are equal, round and reactive to light and accommodation. Funduscopic exam is normal with sharp disc margins noted. Extraocular tracking is good without limitation to gaze excursion or nystagmus noted. Normal smooth pursuit is noted. Hearing is grossly intact. Face is symmetric with normal facial animation and normal facial sensation. Speech is clear with no dysarthria noted. There is no hypophonia. There is no lip, neck/head, jaw or voice tremor. Neck is supple with full range of passive and active motion. There are no carotid bruits on auscultation. Oropharynx exam reveals: mild mouth dryness, adequate dental hygiene and no significant airway crowding. Tongue protrudes centrally and palate elevates symmetrically.   Chest: Clear to auscultation without wheezing, rhonchi or crackles noted.  Heart: S1+S2+0, regular and normal without murmurs, rubs or gallops noted.   Abdomen: Soft, non-tender and non-distended with normal bowel sounds appreciated on auscultation.  Extremities: There is no significant pitting edema in the distal lower extremities bilaterally. Pedal pulses are intact.  Skin: Warm and dry without trophic changes noted.  Musculoskeletal: exam reveals no obvious joint deformities, tenderness or joint swelling or erythema.   Neurologically:  Mental status: The patient  is awake, alert and oriented in all 4 spheres. Her immediate and remote memory, attention, language skills and fund of knowledge are appropriate. There is no evidence of aphasia, agnosia, apraxia or anomia. Speech is clear with normal prosody and enunciation. Thought process is linear. Mood is normal and affect is normal.  Cranial nerves II - XII are as described above under HEENT  exam. In addition: shoulder shrug is normal with equal shoulder height noted. Motor exam: Normal bulk, strength and tone is noted. There is no drift, resting tremor or rebound.  She has a slight bilateral upper extremity postural and action tremor, left side a little bit more noticeable than right.  Romberg is negative. Reflexes are 2+ throughout. Babinski: Toes are flexor bilaterally. Fine motor skills and coordination: intact with normal finger taps, normal hand movements, normal rapid alternating patting, normal foot taps and normal foot agility.  Cerebellar testing: No dysmetria or intention tremor on finger to nose testing. Heel to shin is unremarkable bilaterally. There is no truncal or gait ataxia.  Sensory exam: intact to light touch in the upper and lower extremities.  Gait, station and balance: She stands easily. No veering to one side is noted. No leaning to one side is noted. Posture is Li-appropriate and stance is narrow based. Gait shows normal stride length and normal pace. No problems turning are noted. Tandem walk is unremarkable.   Assessment and Plan:   In summary, Serin Thornell is a very pleasant 39 y.o.-year old female with an underlying medical history of Graves' disease with post ablative hypothyroidism, history of idiopathic hypersomnia on Provigil, migraine headaches, on Imitrex, hyperlipidemia, hyperhidrosis, depression and borderline obesity, who presents for evaluation of her recurrent headaches.  Her history and headache description is mostly in keeping with migraine headaches.  She has had some  increase in her headache frequency around her menstrual period, although the headache description is different from her classic migraines which are typically around the right eye or behind the right eye and associated with nausea.  She has had a more bifrontal headache but description is also not telltale for tension headaches.  Sleep apnea is not completely off the table but she had a sleep study 7 years ago and has lost weight since then.  In addition, she denies any difficulty with her sleep but we can certainly consider sleep testing down the road.  Her neurological exam is nonfocal.  Nevertheless, I would like for her to seek a formal eye examination with an optometrist or ophthalmologist of her choice as she has not had an eye examination in several years.  Strain on her eye muscles can also cause recurrent headaches.  The fact that her headaches are often associated with her menstrual cycle, does point more towards migrainous headaches and she also finds relief with Imitrex.  Generally speaking, she reports her headache frequency currently at 3-4 or less per month.  Imitrex has provided good results but she is afraid that she could run out of it.  She is not quite in favor of pursuing preventative treatment on a day-to-day basis or in the form of Botox.  We talked about preventative treatment options today.  She is advised to continue with Imitrex as needed and we can add Ubrelvy as needed.  We talked about this medication and the sister medication Nurtec as a possible option as well.  We talked about expectations and how to take the medication and common side effects.  Written instructions were provided and a new prescription.  She is agreeable to trying this, she is reminded not to combine Ubrelvy and Imitrex at the same time.  She can take 1 or the other but may benefit from having 2 different options for as needed use.  She is advised to follow-up with one of our nurse practitioners in the next 3 to 4  months, sooner if needed.  I answered all her questions today and she was in agreement. Thank you very much for allowing me to participate in the care of this nice patient. If I can be of any further assistance to you please do not hesitate to call me at [phone].  Sincerely,   Natasha Age, MD, PhD

## 2020-07-05 NOTE — Patient Instructions (Addendum)
I do believe your increased headaches are likely milder migraines around your menstrual.  Although it is not impossible that you have tension headaches or headaches related to strain on your eyes.  Since you have not had an eye examination in years, I would recommend you get a formal eye exam, with any optometrist or ophthalmologist of your choice.  You can continue with the Imitrex 100 mg strength as needed for migraines.  In addition, another good option for as needed use for migraines would be one of the newer medications, such as Ubrelvy or Nurtec.  As discussed, we will start you on Ubrelvy, 50 mg strength: Take 1 pill at onset of migraine headache, may repeat in 2 hours. May cause sedation and nausea.  Please see insert for complete side effect profile.  Do not combine with Imitrex, use only 1 or the other.

## 2020-08-03 ENCOUNTER — Encounter: Payer: Self-pay | Admitting: Adult Health

## 2020-11-04 ENCOUNTER — Ambulatory Visit: Payer: 59 | Admitting: Adult Health

## 2020-11-09 ENCOUNTER — Ambulatory Visit: Payer: 59 | Admitting: Adult Health

## 2020-11-09 ENCOUNTER — Encounter: Payer: Self-pay | Admitting: Adult Health

## 2020-11-09 VITALS — BP 141/93 | HR 96 | Ht 64.0 in | Wt 177.0 lb

## 2020-11-09 DIAGNOSIS — G43709 Chronic migraine without aura, not intractable, without status migrainosus: Secondary | ICD-10-CM | POA: Diagnosis not present

## 2020-11-09 MED ORDER — FROVATRIPTAN SUCCINATE 2.5 MG PO TABS: 2.5000 mg | ORAL_TABLET | ORAL | 3 refills | Status: DC | PRN

## 2020-11-09 NOTE — Patient Instructions (Signed)
Your Plan:  Continue imitrex for abortive therapy for migraines. Take at the onset of migraine- repeat in 2 hours if needed. Take Frovatriptan daily for 3 days before menstrual cycle. DON'T take Imitrex with Frovatriptan.  If your symptoms worsen or you develop new symptoms please let us know.   Thank you for coming to see [place] at [facility]. I hope we have been able to provide you high quality care today.  You may receive a patient satisfaction survey over the next few weeks. We would appreciate your feedback and comments so that we may continue to improve ourselves and the health of our patients.  Frovatriptan tablets What is this medicine? FROVATRIPTAN (froe va TRIP tan) is used to treat migraines with or without aura. An aura is a strange feeling or visual disturbance that warns you of an attack. It is not used to prevent migraines. This medicine may be used for other purposes; ask your health care provider or pharmacist if you have questions. COMMON BRAND NAME(S): Frova What should I tell my health care provider before I take this medicine? They need to know if you have any of these conditions:  cigarette smoker  circulation problems in fingers and toes  diabetes  heart disease  high blood pressure  high cholesterol  history of irregular heartbeat  history of stroke  kidney disease  liver disease  stomach or intestine problems  an unusual or allergic reaction to frovatriptan, other medicines, foods, dyes, or preservatives  pregnant or trying to get pregnant  breast-feeding How should I use this medicine? Take this medicine by mouth with a glass of water. Follow the directions on the prescription label. Do not take it more often than directed. Talk to your pediatrician regarding the use of this medicine in children. Special care may be needed. Overdosage: If you think you have taken too much of this medicine contact a poison control center or  emergency room at once. NOTE: This medicine is only for you. Do not share this medicine with others. What if I miss a dose? This does not apply. This medicine is not for regular use. What may interact with this medicine? Do not take this medicine with any of the following medicines:  certain medicines for migraine headache like almotriptan, eletriptan, frovatriptan, naratriptan, rizatriptan, sumatriptan, zolmitriptan  ergot alkaloids like dihydroergotamine, ergonovine, ergotamine, methylergonovine This medicine may also interact with the following medications:  certain medicines for depression, anxiety, or psychotic disorders  MAOIs like Carbex, Eldepryl, Marplan, Nardil, and Parnate This list may not describe all possible interactions. Give your health care provider a list of all the medicines, herbs, non-prescription drugs, or dietary supplements you use. Also tell them if you smoke, drink alcohol, or use illegal drugs. Some items may interact with your medicine. What should I watch for while using this medicine? Visit your healthcare professional for regular checks on your progress. Tell your healthcare professional if your symptoms do not start to get better or if they get worse. You may get drowsy or dizzy. Do not drive, use machinery, or do anything that needs mental alertness until you know how this medicine affects you. Do not stand up or sit up quickly, especially if you are an older patient. This reduces the risk of dizzy or fainting spells. Alcohol may interfere with the effect of this medicine. Your mouth may get dry. Chewing sugarless gum or sucking hard candy and drinking plenty of water may help. Contact your healthcare professional if the  problem does not go away or is severe. Tell your healthcare professional right away if you have any change in your eyesight. If you take migraine medicines for 10 or more days a month, your migraines may get worse. Keep a diary of headache days  and medicine use. Contact your healthcare professional if your migraine attacks occur more frequently. What side effects may I notice from receiving this medicine? Side effects that you should report to your doctor or health care professional as soon as possible:  allergic reactions like skin rash, itching or hives, swelling of the face, lips, or tongue  changes in vision  chest pain or chest tightness  signs and symptoms of a dangerous change in heartbeat or heart rhythm like chest pain; dizziness; fast, irregular heartbeat; palpitations; feeling faint or lightheaded; falls; breathing problems  signs and symptoms of a stroke like changes in vision; confusion; trouble speaking or understanding; severe headaches; sudden numbness or weakness of the face, arm or leg; trouble walking; dizziness; loss of balance or coordination  signs and symptoms of serotonin syndrome like irritable; confusion; diarrhea; fast or irregular heartbeat; muscle twitching; stiff muscles; trouble walking; sweating; high fever; seizures; chills; vomiting Side effects that usually do not require medical attention (report to your doctor or health care professional if they continue or are bothersome):  diarrhea  dizziness  dry mouth  headache  nausea, vomiting  pain, tingling, numbness in the hands or feet  stomach pain This list may not describe all possible side effects. Call your doctor for medical advice about side effects. You may report side effects to FDA at 1-800-FDA-1088. Where should I keep my medicine? Keep out of the reach of children. Store at room temperature between 20 and 25 degrees C (68 and 77 degrees F). Protect from light and moisture. Throw away any unused medicine after the expiration date. NOTE: This sheet is a summary. It may not cover all possible information. If you have questions about this medicine, talk to your doctor, pharmacist, or health care provider.  2020 Elsevier/Gold Standard  (2018-05-07 14:52:36)

## 2020-11-09 NOTE — Progress Notes (Addendum)
PATIENT: Natasha Li DOB: 07-Jul-1981  REASON FOR VISIT: follow up HISTORY FROM: patient  HISTORY OF PRESENT ILLNESS: Today 11/09/20:  Natasha Li is a 40 year old female with a history of migraine headaches. She returns today for follow-up. She states that she has 1-2 migraines a month. But she also may have an additional 3 to 4 days of headaches during her menstrual cycle. She states that when she takes Imitrex her headache typically resolves. On occasion she may have to take a second dose. She has never been on any preventative medication. Reports that she is on birth control not trying to conceive. Returns today for an evaluation.  HISTORY Natasha Li is a 40 year old right-handed woman with an underlying medical history of Graves' disease with post ablative hypothyroidism, history of idiopathic hypersomnia on Provigil, migraine headaches, on Imitrex, hyperlipidemia, hyperhidrosis, depression and borderline obesity, who reports a longstanding history of migraine headaches.  She has had infrequent migraines and good results with Imitrex.  In the past 6 months, she has had more milder headaches in the bifrontal areas, not typically associated with the right eye pain and nausea that she is used to.  She does have relief with Imitrex for those headaches as well.  These headaches are typically around her menstrual cycle.  She tries ibuprofen or Tylenol before resorting to taking Imitrex.  She has been on Provigil for years.  She was having significant daytime somnolence.  She had a sleep study in or around 2014 which did not show any sleep apnea but she was placed on Provigil.  She also reports that she was told that she never went into REM sleep.  Her tremor is better.  She takes hydroxyzine as needed for anxiety.  She has lost weight over time.  She tries to hydrate well with water and drinks alcohol about once a week, she reports that her sleep is good, she lives alone, but has not been told that she  snores and makes abnormal noises while asleep.  She has woken up with a headache at times.  She has not had an eye examination in years.  She does not have any prescription eyeglasses.  Her typical migraine is located on the right side, particularly starts behind or around her right eye and associated with nausea and light sensitivity.  He denies any increase in stress, no significant change in her lifestyle.  She currently does not have a significant other.  She does not drink any caffeine on a day-to-day basis.  She no longer takes atenolol. I reviewed your office note from 05/06/2020.  I had evaluated her over 7 years ago for tremors.  She denies any muscle spasms or stiffness in her neck muscles or upper shoulder areas.   REVIEW OF SYSTEMS: Out of a complete 14 system review of symptoms, the patient complains only of the following symptoms, and all other reviewed systems are negative.  See HPI  ALLERGIES: No Known Allergies  HOME MEDICATIONS: Outpatient Medications Prior to Visit  Medication Sig Dispense Refill  . FLUoxetine (PROZAC) 20 MG capsule     . hydrOXYzine (ATARAX/VISTARIL) 25 MG tablet Take 25 mg by mouth 3 (three) times daily as needed.    Marland Kitchen levothyroxine (SYNTHROID) 75 MCG tablet Take by mouth.    . levothyroxine (SYNTHROID, LEVOTHROID) 50 MCG tablet     . modafinil (PROVIGIL) 100 MG tablet     . Multiple Vitamin (MULTIVITAMIN) tablet Take 1 tablet by mouth daily.    Gaylord Shih  TRI-CYCLEN, 28, 0.18/0.215/0.25 MG-35 MCG tablet     . SUMAtriptan (IMITREX) 100 MG tablet as needed.     Marland Kitchen Ubrogepant (UBRELVY) 50 MG TABS Take 50 mg by mouth as needed (may repeat once in 2 hours. no more than 2 pills in 24 h.). 10 tablet 3   No facility-administered medications prior to visit.    PAST MEDICAL HISTORY: Past Medical History:  Diagnosis Date  . Anxiety   . Depression   . Graves disease   . Migraine, unspecified, without mention of intractable migraine without mention of status  migrainosus 01/30/2013  . Obesity, unspecified 01/30/2013    PAST SURGICAL HISTORY: Past Surgical History:  Procedure Laterality Date  . MOUTH SURGERY  19 years ago   wisdom teeth    FAMILY HISTORY: Family History  Problem Relation Age of Onset  . Glaucoma Father   . Thyroid disease Maternal Grandfather   . Thyroid disease Maternal Uncle     SOCIAL HISTORY: Social History   Socioeconomic History  . Marital status: Single    Spouse name: Not on file  . Number of children: Not on file  . Years of education: Not on file  . Highest education level: Not on file  Occupational History  . Not on file  Tobacco Use  . Smoking status: Never Smoker  . Smokeless tobacco: Never Used  Substance and Sexual Activity  . Alcohol use: No    Alcohol/week: 1.0 standard drink    Types: 1 Standard drinks or equivalent per week  . Drug use: No  . Sexual activity: Yes    Partners: Male  Other Topics Concern  . Not on file  Social History Narrative            Social Determinants of Health   Financial Resource Strain: Not on file  Food Insecurity: Not on file  Transportation Needs: Not on file  Physical Activity: Not on file  Stress: Not on file  Social Connections: Not on file  Intimate Partner Violence: Not on file      PHYSICAL EXAM  Vitals:   11/09/20 1305  BP: (!) 141/93  Pulse: 96  Weight: 177 lb (80.3 kg)  Height: 5\' 4"  (1.626 m)   Body mass index is 30.38 kg/m.  Generalized: Well developed, in no acute distress   Neurological examination  Mentation: Alert oriented to time, place, history taking. Follows all commands speech and language fluent Cranial nerve II-XII: Pupils were equal round reactive to light. Extraocular movements were full, visual field were full on confrontational test. Facial sensation and strength were normal. Uvula tongue midline. Head turning and shoulder shrug  were normal and symmetric. Motor: The motor testing reveals 5 over 5 strength of  all 4 extremities. Good symmetric motor tone is noted throughout.  Sensory: Sensory testing is intact to soft touch on all 4 extremities. No evidence of extinction is noted.  Coordination: Cerebellar testing reveals good finger-nose-finger and heel-to-shin bilaterally.  Gait and station: Gait is normal. Tandem gait is normal. Romberg is negative. No drift is seen.  Reflexes: Deep tendon reflexes are symmetric and normal bilaterally.   DIAGNOSTIC DATA (LABS, IMAGING, TESTING) - I reviewed patient records, labs, notes, testing and imaging myself where available.     ASSESSMENT AND PLAN 40 y.o. year old female  has a past medical history of Anxiety, Depression, Graves disease, Migraine, unspecified, without mention of intractable migraine without mention of status migrainosus (01/30/2013), and Obesity, unspecified (01/30/2013). here with:  1. Migraine  headache  -Continue Imitrex as an abortive therapy. Advised to take at the onset of a migraine and repeat in 2 hours if needed --We will try frovatriptan 2.5 mg- take 1 tablet daily 3 days before menstrual cycle. Advised not to combine frovatriptan and Imitrex.  --Can consider preventative medication in the future. Today we discussed propranolol and Topamax. --Follow-up in 6 months or sooner if needed   I spent 25 minutes of face-to-face and non-face-to-face time with patient.  This included previsit chart review, lab review, study review, order entry, electronic health record documentation, patient education.  Ward Givens, MSN, NP-C 11/09/2020, 1:09 PM [facility] [address] [place], [place] [address] [phone]  I reviewed the above note and documentation by the Nurse Practitioner and agree with the history, exam, assessment and plan as outlined above. I was available for consultation. Star Age, MD, PhD [facility])

## 2021-05-11 ENCOUNTER — Other Ambulatory Visit: Payer: Self-pay

## 2021-05-11 ENCOUNTER — Ambulatory Visit: Payer: 59 | Admitting: Adult Health

## 2021-05-11 VITALS — BP 134/93 | HR 88 | Ht 64.5 in | Wt 181.8 lb

## 2021-05-11 DIAGNOSIS — G43709 Chronic migraine without aura, not intractable, without status migrainosus: Secondary | ICD-10-CM

## 2021-05-11 MED ORDER — FROVATRIPTAN SUCCINATE 2.5 MG PO TABS: 2.5000 mg | ORAL_TABLET | ORAL | 3 refills | Status: DC | PRN

## 2021-05-11 NOTE — Patient Instructions (Signed)
Your Plan:  Continue Frovatriptan  If your symptoms worsen or you develop new symptoms please let us know.    Thank you for coming to see [place] at [facility]. I hope we have been able to provide you high quality care today.  You may receive a patient satisfaction survey over the next few weeks. We would appreciate your feedback and comments so that we may continue to improve ourselves and the health of our patients.

## 2021-05-11 NOTE — Progress Notes (Addendum)
PATIENT: Natasha Li DOB: 05-Nov-1981  REASON FOR VISIT: follow up HISTORY FROM: patient Primary neurologist: Dr. Rexene Alberts  HISTORY OF PRESENT ILLNESS: Today 05/11/21:  Natasha Li is a 40 year old female with a history of migraine headaches.  She returns today for follow-up.  She states that since she started frovatriptan she is only had 1 headache since last seen.  She states this medication has done well in preventing her migraines during her menstrual cycle.  She returns today for an evaluation.  11/09/20: Natasha Li is a 40 year old female with a history of migraine headaches. She returns today for follow-up. She states that she has 1-2 migraines a month. But she also may have an additional 3 to 4 days of headaches during her menstrual cycle. She states that when she takes Imitrex her headache typically resolves. On occasion she may have to take a second dose. She has never been on any preventative medication. Reports that she is on birth control not trying to conceive. Returns today for an evaluation.  HISTORY Natasha Li is a 40 year old right-handed woman with an underlying medical history of Graves' disease with post ablative hypothyroidism, history of idiopathic hypersomnia on Provigil, migraine headaches, on Imitrex, hyperlipidemia, hyperhidrosis, depression and borderline obesity, who reports a longstanding history of migraine headaches.  She has had infrequent migraines and good results with Imitrex.  In the past 6 months, she has had more milder headaches in the bifrontal areas, not typically associated with the right eye pain and nausea that she is used to.  She does have relief with Imitrex for those headaches as well.  These headaches are typically around her menstrual cycle.  She tries ibuprofen or Tylenol before resorting to taking Imitrex.  She has been on Provigil for years.  She was having significant daytime somnolence.  She had a sleep study in or around 2014 which did not show any  sleep apnea but she was placed on Provigil.  She also reports that she was told that she never went into REM sleep.  Her tremor is better.  She takes hydroxyzine as needed for anxiety.  She has lost weight over time.  She tries to hydrate well with water and drinks alcohol about once a week, she reports that her sleep is good, she lives alone, but has not been told that she snores and makes abnormal noises while asleep.  She has woken up with a headache at times.  She has not had an eye examination in years.  She does not have any prescription eyeglasses.  Her typical migraine is located on the right side, particularly starts behind or around her right eye and associated with nausea and light sensitivity.  He denies any increase in stress, no significant change in her lifestyle.  She currently does not have a significant other.  She does not drink any caffeine on a day-to-day basis.  She no longer takes atenolol. I reviewed your office note from 05/06/2020.  I had evaluated her over 7 years ago for tremors.  She denies any muscle spasms or stiffness in her neck muscles or upper shoulder areas.    REVIEW OF SYSTEMS: Out of a complete 14 system review of symptoms, the patient complains only of the following symptoms, and all other reviewed systems are negative.  See HPI  ALLERGIES: No Known Allergies  HOME MEDICATIONS: Outpatient Medications Prior to Visit  Medication Sig Dispense Refill   FLUoxetine (PROZAC) 20 MG capsule      frovatriptan (FROVA) 2.5  MG tablet Take 1 tablet (2.5 mg total) by mouth as needed for migraine. Take 1 tablet daily 3 days before menstrual cycle. Do not take with imitrex. 10 tablet 3   hydrOXYzine (ATARAX/VISTARIL) 25 MG tablet Take 25 mg by mouth 3 (three) times daily as needed.     levothyroxine (SYNTHROID, LEVOTHROID) 50 MCG tablet      modafinil (PROVIGIL) 100 MG tablet      Multiple Vitamin (MULTIVITAMIN) tablet Take 1 tablet by mouth daily.     ORTHO TRI-CYCLEN, 28,  0.18/0.215/0.25 MG-35 MCG tablet      SUMAtriptan (IMITREX) 100 MG tablet as needed.      levothyroxine (SYNTHROID) 75 MCG tablet Take by mouth.     No facility-administered medications prior to visit.    PAST MEDICAL HISTORY: Past Medical History:  Diagnosis Date   Anxiety    Depression    Graves disease    Migraine, unspecified, without mention of intractable migraine without mention of status migrainosus 01/30/2013   Obesity, unspecified 01/30/2013    PAST SURGICAL HISTORY: Past Surgical History:  Procedure Laterality Date   MOUTH SURGERY  19 years ago   wisdom teeth    FAMILY HISTORY: Family History  Problem Relation Age of Onset   Glaucoma Father    Thyroid disease Maternal Grandfather    Thyroid disease Maternal Uncle     SOCIAL HISTORY: Social History   Socioeconomic History   Marital status: Single    Spouse name: Not on file   Number of children: Not on file   Years of education: Not on file   Highest education level: Not on file  Occupational History   Not on file  Tobacco Use   Smoking status: Never   Smokeless tobacco: Never  Substance and Sexual Activity   Alcohol use: No    Alcohol/week: 1.0 standard drink    Types: 1 Standard drinks or equivalent per week   Drug use: No   Sexual activity: Yes    Partners: Male  Other Topics Concern   Not on file  Social History Narrative            Social Determinants of Health   Financial Resource Strain: Not on file  Food Insecurity: Not on file  Transportation Needs: Not on file  Physical Activity: Not on file  Stress: Not on file  Social Connections: Not on file  Intimate Partner Violence: Not on file      PHYSICAL EXAM  Vitals:   05/11/21 1406  BP: (!) 134/93  Pulse: 88  Weight: 181 lb 12.8 oz (82.5 kg)  Height: 5' 4.5" (1.638 m)   Body mass index is 30.72 kg/m.  Generalized: Well developed, in no acute distress   Neurological examination  Mentation: Alert oriented to time,  place, history taking. Follows all commands speech and language fluent Cranial nerve II-XII: Pupils were equal round reactive to light. Extraocular movements were full, visual field were full on confrontational test. Facial sensation and strength were normal. Uvula tongue midline. Head turning and shoulder shrug  were normal and symmetric. Motor: The motor testing reveals 5 over 5 strength of all 4 extremities. Good symmetric motor tone is noted throughout.  Sensory: Sensory testing is intact to soft touch on all 4 extremities. No evidence of extinction is noted.  Coordination: Cerebellar testing reveals good finger-nose-finger and heel-to-shin bilaterally.  Gait and station: Gait is normal. Tandem gait is normal. Romberg is negative. No drift is seen.  Reflexes: Deep tendon reflexes  are symmetric and normal bilaterally.   DIAGNOSTIC DATA (LABS, IMAGING, TESTING) - I reviewed patient records, labs, notes, testing and imaging myself where available.     ASSESSMENT AND PLAN 40 y.o. year old female  has a past medical history of Anxiety, Depression, Graves disease, Migraine, unspecified, without mention of intractable migraine without mention of status migrainosus (01/30/2013), and Obesity, unspecified (01/30/2013). here with:  1. Migraine headache  -Continue Imitrex as an abortive therapy. Advised to take at the onset of a migraine and repeat in 2 hours if needed --continue frovatriptan 2.5 mg- take 1 tablet daily 3 days before menstrual cycle. Advised not to combine frovatriptan and Imitrex.  --Follow-up in 1  year, sooner if needed   Ward Givens, MSN, NP-C 05/11/2021, 2:10 PM [facility] [address], [place], [place] [address] [phone]  I reviewed the above note and documentation by the Nurse Practitioner and agree with the history, exam, assessment and plan as outlined above. I was available for consultation. Star Age, MD, PhD [facility])

## 2022-05-11 ENCOUNTER — Ambulatory Visit: Payer: 59 | Admitting: Adult Health

## 2022-05-11 ENCOUNTER — Encounter: Payer: Self-pay | Admitting: Adult Health

## 2022-05-11 VITALS — BP 134/89 | HR 90 | Ht 64.5 in | Wt 177.4 lb

## 2022-05-11 DIAGNOSIS — G43709 Chronic migraine without aura, not intractable, without status migrainosus: Secondary | ICD-10-CM

## 2022-05-11 MED ORDER — FROVATRIPTAN SUCCINATE 2.5 MG PO TABS: 2.5000 mg | ORAL_TABLET | ORAL | 3 refills | Status: AC | PRN

## 2022-05-11 NOTE — Progress Notes (Signed)
PATIENT: Jniyah Dantuono DOB: [birth year]  REASON FOR VISIT: follow up HISTORY FROM: patient Primary neurologist: Dr. Rexene Alberts  Chief Complaint  Patient presents with   Follow-up    Rm 21, alone.  Doing well, stable.       HISTORY OF PRESENT ILLNESS: Today 05/11/22:  Ms. Gritz is a 41 year old female with a history of migraine headaches.  She returns today for follow-up. Reports that each month can vary but on avg 1-2 a month. Use frova pre-menstrual cycle to help prevent and then imitrex for any other headaches. Imitrex works well to get rid for her migraines except the hormonal migraines.   05/11/2021: Ms. Devito is a 41 year old female with a history of migraine headaches.  She returns today for follow-up.  She states that since she started frovatriptan she is only had 1 headache since last seen.  She states this medication has done well in preventing her migraines during her menstrual cycle.  She returns today for an evaluation.  11/09/20: Ms. Corral is a 41 year old female with a history of migraine headaches. She returns today for follow-up. She states that she has 1-2 migraines a month. But she also may have an additional 3 to 4 days of headaches during her menstrual cycle. She states that when she takes Imitrex her headache typically resolves. On occasion she may have to take a second dose. She has never been on any preventative medication. Reports that she is on birth control not trying to conceive. Returns today for an evaluation.  HISTORY Ms. Frohlich is a 41 year old right-handed woman with an underlying medical history of Graves' disease with post ablative hypothyroidism, history of idiopathic hypersomnia on Provigil, migraine headaches, on Imitrex, hyperlipidemia, hyperhidrosis, depression and borderline obesity, who reports a longstanding history of migraine headaches.  She has had infrequent migraines and good results with Imitrex.  In the past 6 months, she has had more milder headaches  in the bifrontal areas, not typically associated with the right eye pain and nausea that she is used to.  She does have relief with Imitrex for those headaches as well.  These headaches are typically around her menstrual cycle.  She tries ibuprofen or Tylenol before resorting to taking Imitrex.  She has been on Provigil for years.  She was having significant daytime somnolence.  She had a sleep study in or around 2014 which did not show any sleep apnea but she was placed on Provigil.  She also reports that she was told that she never went into REM sleep.  Her tremor is better.  She takes hydroxyzine as needed for anxiety.  She has lost weight over time.  She tries to hydrate well with water and drinks alcohol about once a week, she reports that her sleep is good, she lives alone, but has not been told that she snores and makes abnormal noises while asleep.  She has woken up with a headache at times.  She has not had an eye examination in years.  She does not have any prescription eyeglasses.  Her typical migraine is located on the right side, particularly starts behind or around her right eye and associated with nausea and light sensitivity.  He denies any increase in stress, no significant change in her lifestyle.  She currently does not have a significant other.  She does not drink any caffeine on a day-to-day basis.  She no longer takes atenolol. I reviewed your office note from 05/06/2020.  I had evaluated her over 7 years  ago for tremors.  She denies any muscle spasms or stiffness in her neck muscles or upper shoulder areas.    REVIEW OF SYSTEMS: Out of a complete 14 system review of symptoms, the patient complains only of the following symptoms, and all other reviewed systems are negative.  See HPI  ALLERGIES: Allergies  Allergen Reactions   Zolpidem Other (See Comments)    Patient states hallucinations and couldn't go to sleep    HOME MEDICATIONS: Outpatient Medications Prior to Visit   Medication Sig Dispense Refill   FLUoxetine (PROZAC) 20 MG capsule      frovatriptan (FROVA) 2.5 MG tablet Take 1 tablet (2.5 mg total) by mouth as needed for migraine. Take 1 tablet daily 3 days before menstrual cycle. Do not take with imitrex. 30 tablet 3   hydrOXYzine (ATARAX/VISTARIL) 25 MG tablet Take 25 mg by mouth 3 (three) times daily as needed.     modafinil (PROVIGIL) 100 MG tablet      Multiple Vitamin (MULTIVITAMIN) tablet Take 1 tablet by mouth daily.     ORTHO TRI-CYCLEN, 28, 0.18/0.215/0.25 MG-35 MCG tablet      SUMAtriptan (IMITREX) 100 MG tablet as needed.      levothyroxine (SYNTHROID) 75 MCG tablet Take by mouth.     levothyroxine (SYNTHROID, LEVOTHROID) 50 MCG tablet  (Patient not taking: Reported on 05/11/2022)     No facility-administered medications prior to visit.    PAST MEDICAL HISTORY: Past Medical History:  Diagnosis Date   Anxiety    Depression    Graves disease    Migraine, unspecified, without mention of intractable migraine without mention of status migrainosus 01/30/2013   Obesity, unspecified 01/30/2013    PAST SURGICAL HISTORY: Past Surgical History:  Procedure Laterality Date   MOUTH SURGERY  19 years ago   wisdom teeth    FAMILY HISTORY: Family History  Problem Relation Age of Onset   Glaucoma Father    Thyroid disease Maternal Grandfather    Thyroid disease Maternal Uncle     SOCIAL HISTORY: Social History   Socioeconomic History   Marital status: Single    Spouse name: Not on file   Number of children: Not on file   Years of education: Not on file   Highest education level: Not on file  Occupational History   Not on file  Tobacco Use   Smoking status: Never   Smokeless tobacco: Never  Substance and Sexual Activity   Alcohol use: No    Alcohol/week: 1.0 standard drink of alcohol    Types: 1 Standard drinks or equivalent per week   Drug use: No   Sexual activity: Yes    Partners: Male  Other Topics Concern   Not on file   Social History Narrative            Social Determinants of Health   Financial Resource Strain: Not on file  Food Insecurity: Not on file  Transportation Needs: Not on file  Physical Activity: Not on file  Stress: Not on file  Social Connections: Not on file  Intimate Partner Violence: Not on file      PHYSICAL EXAM  Vitals:   05/11/22 1335  BP: 134/89  Pulse: 90  Weight: 177 lb 6.4 oz (80.5 kg)  Height: 5' 4.5" (1.638 m)   Body mass index is 29.98 kg/m.  Generalized: Well developed, in no acute distress   Neurological examination  Mentation: Alert oriented to time, place, history taking. Follows all commands speech and language fluent  Cranial nerve II-XII: Pupils were equal round reactive to light. Extraocular movements were full, visual field were full on confrontational test. Facial sensation and strength were normal. Uvula tongue midline. Head turning and shoulder shrug  were normal and symmetric. Motor: The motor testing reveals 5 over 5 strength of all 4 extremities. Good symmetric motor tone is noted throughout.  Sensory: Sensory testing is intact to soft touch on all 4 extremities. No evidence of extinction is noted.  Coordination: Cerebellar testing reveals good finger-nose-finger and heel-to-shin bilaterally.  Gait and station: Gait is normal.   DIAGNOSTIC DATA (LABS, IMAGING, TESTING) - I reviewed patient records, labs, notes, testing and imaging myself where available.     ASSESSMENT AND PLAN 41 y.o. year old female  has a past medical history of Anxiety, Depression, Graves disease, Migraine, unspecified, without mention of intractable migraine without mention of status migrainosus (01/30/2013), and Obesity, unspecified (01/30/2013). here with:  1. Migraine headache  -Continue Imitrex as an abortive therapy. Advised to take at the onset of a migraine and repeat in 2 hours if needed --continue frovatriptan 2.5 mg- take 1 tablet daily 3 days before  menstrual cycle. Advised not to combine frovatriptan and Imitrex.  -- Patient has done well with these 2 medications.  Her primary care is currently refilling Imitrex.  We will ask that they also refill frovatriptan in the future.  Patient will follow-up with our office on an as-needed basis   Ward Givens, MSN, NP-C 05/11/2022, 1:47 PM [facility] [address] [place], [place] [address] [phone]

## 2022-11-16 ENCOUNTER — Other Ambulatory Visit: Payer: Self-pay | Admitting: Obstetrics and Gynecology

## 2022-11-16 ENCOUNTER — Other Ambulatory Visit (HOSPITAL_COMMUNITY)
Admission: RE | Admit: 2022-11-16 | Discharge: 2022-11-16 | Disposition: A | Discharge status: Home / Self Care | Payer: 59 | Source: Ambulatory Visit | Attending: Obstetrics and Gynecology | Admitting: Obstetrics and Gynecology

## 2022-11-16 DIAGNOSIS — Z01419 Encounter for gynecological examination (general) (routine) without abnormal findings: Secondary | ICD-10-CM | POA: Insufficient documentation

## 2022-11-17 ENCOUNTER — Other Ambulatory Visit (HOSPITAL_BASED_OUTPATIENT_CLINIC_OR_DEPARTMENT_OTHER): Payer: Self-pay | Admitting: Family Medicine

## 2022-11-17 DIAGNOSIS — E785 Hyperlipidemia, unspecified: Secondary | ICD-10-CM

## 2022-11-22 LAB — CYTOLOGY - PAP
Comment: NEGATIVE
Diagnosis: NEGATIVE
High risk HPV: NEGATIVE

## 2023-01-23 ENCOUNTER — Ambulatory Visit (HOSPITAL_COMMUNITY)
Admission: RE | Admit: 2023-01-23 | Discharge: 2023-01-23 | Disposition: A | Discharge status: Home / Self Care | Payer: 59 | Source: Ambulatory Visit | Attending: Family Medicine | Admitting: Family Medicine

## 2023-01-23 DIAGNOSIS — E785 Hyperlipidemia, unspecified: Secondary | ICD-10-CM | POA: Insufficient documentation

## 2023-12-12 ENCOUNTER — Other Ambulatory Visit: Payer: Self-pay | Admitting: Obstetrics and Gynecology

## 2023-12-12 DIAGNOSIS — Z1231 Encounter for screening mammogram for malignant neoplasm of breast: Secondary | ICD-10-CM

## 2023-12-20 ENCOUNTER — Ambulatory Visit
Admission: RE | Admit: 2023-12-20 | Discharge: 2023-12-20 | Disposition: A | Discharge status: Home / Self Care | Payer: 59 | Source: Ambulatory Visit | Attending: Obstetrics and Gynecology | Admitting: Obstetrics and Gynecology

## 2023-12-20 DIAGNOSIS — Z1231 Encounter for screening mammogram for malignant neoplasm of breast: Secondary | ICD-10-CM

## 2024-12-03 ENCOUNTER — Other Ambulatory Visit: Payer: Self-pay | Admitting: Family Medicine

## 2024-12-03 DIAGNOSIS — Z1231 Encounter for screening mammogram for malignant neoplasm of breast: Secondary | ICD-10-CM

## 2024-12-22 ENCOUNTER — Ambulatory Visit
